# Patient Record
Sex: Male | Born: 2005 | Race: White | Hispanic: No | Marital: Single | Smoking: Never smoker
Health system: Southern US, Community
[De-identification: ages and names within clinical notes are randomized; demographics above are authoritative.]

## PROBLEM LIST (undated history)

## (undated) DIAGNOSIS — K219 Gastro-esophageal reflux disease without esophagitis: Secondary | ICD-10-CM

## (undated) DIAGNOSIS — R519 Headache, unspecified: Secondary | ICD-10-CM

## (undated) DIAGNOSIS — K8020 Calculus of gallbladder without cholecystitis without obstruction: Secondary | ICD-10-CM

## (undated) DIAGNOSIS — J45909 Unspecified asthma, uncomplicated: Secondary | ICD-10-CM

## (undated) DIAGNOSIS — Z8669 Personal history of other diseases of the nervous system and sense organs: Secondary | ICD-10-CM

## (undated) HISTORY — DX: Body mass index (BMI) 50.0-59.9, adult: E66.01

## (undated) HISTORY — DX: Personal history of other diseases of the nervous system and sense organs: Z86.69

## (undated) HISTORY — PX: WISDOM TOOTH EXTRACTION: SHX21

## (undated) HISTORY — DX: Calculus of gallbladder without cholecystitis without obstruction: K80.20

## (undated) HISTORY — DX: Gastro-esophageal reflux disease without esophagitis: K21.9

---
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
## 2023-01-06 ENCOUNTER — Inpatient Hospital Stay: Payer: Medicaid Other | Admitting: Oncology

## 2023-01-06 ENCOUNTER — Encounter: Payer: Self-pay | Admitting: Oncology

## 2023-01-06 LAB — FACTOR 5 LEIDEN

## 2023-01-13 ENCOUNTER — Inpatient Hospital Stay: Payer: Medicaid Other | Admitting: Oncology

## 2023-01-14 ENCOUNTER — Telehealth: Payer: Self-pay | Admitting: Oncology

## 2023-01-14 NOTE — Telephone Encounter (Signed)
Spoke to Terry Sharp about rescheduling his missed mychart appt with Dr. Smith Terry. He said he could see his lab results on mychart and felt like he was fine. I told him to let us know if he changes his mind and would like to reschedule.

## 2023-01-18 ENCOUNTER — Ambulatory Visit: Payer: Medicaid Other | Admitting: Surgery

## 2023-03-06 ENCOUNTER — Other Ambulatory Visit: Payer: Self-pay

## 2023-03-06 ENCOUNTER — Observation Stay
Admission: EM | Admit: 2023-03-06 | Discharge: 2023-03-06 | Disposition: A | Discharge status: Home / Self Care | Payer: Medicaid Other | Attending: Emergency Medicine | Admitting: Emergency Medicine

## 2023-03-06 ENCOUNTER — Observation Stay: Payer: Medicaid Other | Admitting: Anesthesiology

## 2023-03-06 ENCOUNTER — Other Ambulatory Visit: Payer: Self-pay | Admitting: Surgery

## 2023-03-06 ENCOUNTER — Emergency Department: Payer: Medicaid Other

## 2023-03-06 ENCOUNTER — Encounter
Admission: EM | Disposition: A | Discharge status: Home / Self Care | Payer: Self-pay | Source: Home / Self Care | Attending: Emergency Medicine

## 2023-03-06 DIAGNOSIS — R1011 Right upper quadrant pain: Secondary | ICD-10-CM | POA: Diagnosis present

## 2023-03-06 DIAGNOSIS — K8000 Calculus of gallbladder with acute cholecystitis without obstruction: Secondary | ICD-10-CM | POA: Diagnosis not present

## 2023-03-06 DIAGNOSIS — Z79899 Other long term (current) drug therapy: Secondary | ICD-10-CM | POA: Insufficient documentation

## 2023-03-06 DIAGNOSIS — K819 Cholecystitis, unspecified: Principal | ICD-10-CM

## 2023-03-06 DIAGNOSIS — J45909 Unspecified asthma, uncomplicated: Secondary | ICD-10-CM | POA: Diagnosis not present

## 2023-03-06 DIAGNOSIS — K8012 Calculus of gallbladder with acute and chronic cholecystitis without obstruction: Secondary | ICD-10-CM | POA: Diagnosis not present

## 2023-03-06 DIAGNOSIS — F17290 Nicotine dependence, other tobacco product, uncomplicated: Secondary | ICD-10-CM | POA: Insufficient documentation

## 2023-03-06 LAB — URINALYSIS, ROUTINE W REFLEX MICROSCOPIC
Bilirubin Urine: NEGATIVE
Glucose, UA: NEGATIVE mg/dL
Hgb urine dipstick: NEGATIVE
Ketones, ur: NEGATIVE mg/dL
Leukocytes,Ua: NEGATIVE
Nitrite: NEGATIVE
Protein, ur: NEGATIVE mg/dL
Specific Gravity, Urine: 1.011 (ref 1.005–1.030)
pH: 6 (ref 5.0–8.0)

## 2023-03-06 LAB — COMPREHENSIVE METABOLIC PANEL
ALT: 54 U/L — ABNORMAL HIGH (ref 0–44)
AST: 30 U/L (ref 15–41)
Albumin: 4.1 g/dL (ref 3.5–5.0)
Alkaline Phosphatase: 47 U/L (ref 38–126)
Anion gap: 11 (ref 5–15)
BUN: 10 mg/dL (ref 6–20)
CO2: 21 mmol/L — ABNORMAL LOW (ref 22–32)
Calcium: 9.3 mg/dL (ref 8.9–10.3)
Chloride: 108 mmol/L (ref 98–111)
Creatinine, Ser: 0.87 mg/dL (ref 0.61–1.24)
GFR, Estimated: >60 mL/min (ref >60)
Glucose, Bld: 100 mg/dL — ABNORMAL HIGH (ref 70–99)
Potassium: 4.4 mmol/L (ref 3.5–5.1)
Sodium: 140 mmol/L (ref 135–145)
Total Bilirubin: 0.9 mg/dL (ref 0.3–1.2)
Total Protein: 7.5 g/dL (ref 6.5–8.1)

## 2023-03-06 LAB — CBC
HCT: 46.5 % (ref 39.0–52.0)
Hemoglobin: 16.1 g/dL (ref 13.0–17.0)
MCH: 30.1 pg (ref 26.0–34.0)
MCHC: 34.6 g/dL (ref 30.0–36.0)
MCV: 86.9 fL (ref 80.0–100.0)
Platelets: 246 10*3/uL (ref 150–400)
RBC: 5.35 MIL/uL (ref 4.22–5.81)
RDW: 12.4 % (ref 11.5–15.5)
WBC: 12.7 10*3/uL — ABNORMAL HIGH (ref 4.0–10.5)
nRBC: 0 % (ref 0.0–0.2)

## 2023-03-06 LAB — LIPASE, BLOOD: Lipase: 29 U/L (ref 11–51)

## 2023-03-06 SURGERY — CHOLECYSTECTOMY, ROBOT-ASSISTED, LAPAROSCOPIC
Anesthesia: General | Site: Abdomen

## 2023-03-06 MED ORDER — SODIUM CHLORIDE 0.9 % IV SOLN
2.0000 g | INTRAVENOUS | Status: DC
  Administered 2023-03-06: 2 g via INTRAVENOUS
  Filled 2023-03-06: qty 20

## 2023-03-06 MED ORDER — SODIUM CHLORIDE 0.9 % IV BOLUS
500.0000 mL | Freq: Once | INTRAVENOUS | Status: AC
  Administered 2023-03-06: 500 mL via INTRAVENOUS

## 2023-03-06 MED ORDER — OXYCODONE-ACETAMINOPHEN 5-325 MG PO TABS
1.0000 | ORAL_TABLET | ORAL | 0 refills | Status: AC | PRN
Start: 2023-03-06 — End: ?

## 2023-03-06 MED ORDER — DEXMEDETOMIDINE HCL IN NACL 80 MCG/20ML IV SOLN
INTRAVENOUS | Status: DC | PRN
Start: 2023-03-06 — End: 2023-03-06
  Administered 2023-03-06 (×5): 4 ug via INTRAVENOUS

## 2023-03-06 MED ORDER — PROPOFOL 10 MG/ML IV BOLUS
INTRAVENOUS | Status: DC | PRN
  Administered 2023-03-06: 300 mg via INTRAVENOUS

## 2023-03-06 MED ORDER — BUPIVACAINE LIPOSOME 1.3 % IJ SUSP
INTRAMUSCULAR | Status: AC
  Filled 2023-03-06: qty 20

## 2023-03-06 MED ORDER — FENTANYL CITRATE (PF) 100 MCG/2ML IJ SOLN
INTRAMUSCULAR | Status: DC | PRN
  Administered 2023-03-06: 25 ug via INTRAVENOUS
  Administered 2023-03-06: 50 ug via INTRAVENOUS
  Administered 2023-03-06: 25 ug via INTRAVENOUS

## 2023-03-06 MED ORDER — OXYCODONE-ACETAMINOPHEN 5-325 MG PO TABS
1.0000 | ORAL_TABLET | ORAL | Status: DC | PRN
  Administered 2023-03-06: 1 via ORAL

## 2023-03-06 MED ORDER — OXYCODONE-ACETAMINOPHEN 5-325 MG PO TABS
ORAL_TABLET | ORAL | Status: AC
  Filled 2023-03-06: qty 1

## 2023-03-06 MED ORDER — SUCCINYLCHOLINE CHLORIDE 200 MG/10ML IV SOSY
PREFILLED_SYRINGE | INTRAVENOUS | Status: AC
  Filled 2023-03-06: qty 10

## 2023-03-06 MED ORDER — PHENYLEPHRINE 80 MCG/ML (10ML) SYRINGE FOR IV PUSH (FOR BLOOD PRESSURE SUPPORT)
PREFILLED_SYRINGE | INTRAVENOUS | Status: DC | PRN
  Administered 2023-03-06: 160 ug via INTRAVENOUS

## 2023-03-06 MED ORDER — 0.9 % SODIUM CHLORIDE (POUR BTL) OPTIME
TOPICAL | Status: DC | PRN
  Administered 2023-03-06: 500 mL

## 2023-03-06 MED ORDER — FENTANYL CITRATE PF 50 MCG/ML IJ SOSY
50.0000 ug | PREFILLED_SYRINGE | INTRAMUSCULAR | Status: DC | PRN
  Administered 2023-03-06: 50 ug via INTRAVENOUS
  Filled 2023-03-06: qty 1

## 2023-03-06 MED ORDER — BUPIVACAINE-EPINEPHRINE (PF) 0.25% -1:200000 IJ SOLN
INTRAMUSCULAR | Status: AC
  Filled 2023-03-06: qty 30

## 2023-03-06 MED ORDER — LACTATED RINGERS IV SOLN: INTRAVENOUS | Status: DC | PRN

## 2023-03-06 MED ORDER — SUGAMMADEX SODIUM 200 MG/2ML IV SOLN
INTRAVENOUS | Status: DC | PRN
  Administered 2023-03-06: 400 mg via INTRAVENOUS

## 2023-03-06 MED ORDER — DEXAMETHASONE SODIUM PHOSPHATE 10 MG/ML IJ SOLN
INTRAMUSCULAR | Status: AC
  Filled 2023-03-06: qty 1

## 2023-03-06 MED ORDER — ONDANSETRON HCL 4 MG/2ML IJ SOLN
INTRAMUSCULAR | Status: AC
  Filled 2023-03-06: qty 2

## 2023-03-06 MED ORDER — MIDAZOLAM HCL 2 MG/2ML IJ SOLN
INTRAMUSCULAR | Status: DC | PRN
  Administered 2023-03-06: 2 mg via INTRAVENOUS

## 2023-03-06 MED ORDER — HYDROMORPHONE HCL 1 MG/ML IJ SOLN
INTRAMUSCULAR | Status: DC | PRN
Start: 2023-03-06 — End: 2023-03-06
  Administered 2023-03-06 (×2): .5 mg via INTRAVENOUS

## 2023-03-06 MED ORDER — ONDANSETRON HCL 4 MG/2ML IJ SOLN
4.0000 mg | Freq: Once | INTRAMUSCULAR | Status: AC | PRN
  Administered 2023-03-06: 4 mg via INTRAVENOUS
  Filled 2023-03-06: qty 2

## 2023-03-06 MED ORDER — LIDOCAINE HCL (PF) 2 % IJ SOLN
INTRAMUSCULAR | Status: AC
  Filled 2023-03-06: qty 5

## 2023-03-06 MED ORDER — DEXAMETHASONE SODIUM PHOSPHATE 10 MG/ML IJ SOLN
INTRAMUSCULAR | Status: DC | PRN
  Administered 2023-03-06: 10 mg via INTRAVENOUS

## 2023-03-06 MED ORDER — FENTANYL CITRATE (PF) 100 MCG/2ML IJ SOLN
INTRAMUSCULAR | Status: AC
  Filled 2023-03-06: qty 2

## 2023-03-06 MED ORDER — MORPHINE SULFATE (PF) 4 MG/ML IV SOLN
4.0000 mg | Freq: Once | INTRAVENOUS | Status: AC
  Administered 2023-03-06: 4 mg via INTRAVENOUS
  Filled 2023-03-06: qty 1

## 2023-03-06 MED ORDER — PHENYLEPHRINE HCL-NACL 20-0.9 MG/250ML-% IV SOLN
INTRAVENOUS | Status: AC
  Filled 2023-03-06: qty 250

## 2023-03-06 MED ORDER — SODIUM CHLORIDE 0.9 % IR SOLN
Status: DC | PRN
Start: 2023-03-06 — End: 2023-03-06
  Administered 2023-03-06: 3000 mL

## 2023-03-06 MED ORDER — ROCURONIUM BROMIDE 10 MG/ML (PF) SYRINGE
PREFILLED_SYRINGE | INTRAVENOUS | Status: AC
  Filled 2023-03-06: qty 10

## 2023-03-06 MED ORDER — FAMOTIDINE IN NACL 20-0.9 MG/50ML-% IV SOLN
20.0000 mg | Freq: Once | INTRAVENOUS | Status: AC
  Administered 2023-03-06: 20 mg via INTRAVENOUS
  Filled 2023-03-06: qty 50

## 2023-03-06 MED ORDER — ONDANSETRON HCL 4 MG/2ML IJ SOLN
4.0000 mg | Freq: Once | INTRAMUSCULAR | Status: AC
  Administered 2023-03-06: 4 mg via INTRAVENOUS
  Filled 2023-03-06: qty 2

## 2023-03-06 MED ORDER — PROPOFOL 10 MG/ML IV BOLUS
INTRAVENOUS | Status: AC
  Filled 2023-03-06: qty 40

## 2023-03-06 MED ORDER — SUCCINYLCHOLINE CHLORIDE 200 MG/10ML IV SOSY
PREFILLED_SYRINGE | INTRAVENOUS | Status: DC | PRN
  Administered 2023-03-06: 180 mg via INTRAVENOUS

## 2023-03-06 MED ORDER — LIDOCAINE HCL (CARDIAC) PF 100 MG/5ML IV SOSY
PREFILLED_SYRINGE | INTRAVENOUS | Status: DC | PRN
  Administered 2023-03-06: 100 mg via INTRAVENOUS

## 2023-03-06 MED ORDER — ACETAMINOPHEN 10 MG/ML IV SOLN
INTRAVENOUS | Status: DC | PRN
Start: 2023-03-06 — End: 2023-03-06
  Administered 2023-03-06: 1000 mg via INTRAVENOUS

## 2023-03-06 MED ORDER — INDOCYANINE GREEN 25 MG IV SOLR
INTRAVENOUS | Status: AC
  Filled 2023-03-06: qty 10

## 2023-03-06 MED ORDER — HYDROMORPHONE HCL 1 MG/ML IJ SOLN: 0.5000 mg | INTRAMUSCULAR | Status: DC | PRN

## 2023-03-06 MED ORDER — HYDROMORPHONE HCL 1 MG/ML IJ SOLN
INTRAMUSCULAR | Status: AC
  Filled 2023-03-06: qty 1

## 2023-03-06 MED ORDER — ONDANSETRON HCL 4 MG/2ML IJ SOLN
4.0000 mg | Freq: Once | INTRAMUSCULAR | Status: AC
  Administered 2023-03-06: 4 mg via INTRAVENOUS

## 2023-03-06 MED ORDER — ACETAMINOPHEN 10 MG/ML IV SOLN
INTRAVENOUS | Status: AC
  Filled 2023-03-06: qty 100

## 2023-03-06 MED ORDER — MIDAZOLAM HCL 2 MG/2ML IJ SOLN
INTRAMUSCULAR | Status: AC
  Filled 2023-03-06: qty 2

## 2023-03-06 MED ORDER — DEXMEDETOMIDINE HCL IN NACL 80 MCG/20ML IV SOLN
INTRAVENOUS | Status: AC
  Filled 2023-03-06: qty 20

## 2023-03-06 MED ORDER — BUPIVACAINE-EPINEPHRINE (PF) 0.25% -1:200000 IJ SOLN
INTRAMUSCULAR | Status: DC | PRN
  Administered 2023-03-06 (×2): 15 mL

## 2023-03-06 MED ORDER — INDOCYANINE GREEN 25 MG IV SOLR
2.5000 mg | Freq: Once | INTRAVENOUS | Status: AC
  Administered 2023-03-06: 2.5 mg via INTRAVENOUS
  Filled 2023-03-06: qty 1

## 2023-03-06 MED ORDER — ROCURONIUM BROMIDE 100 MG/10ML IV SOLN
INTRAVENOUS | Status: DC | PRN
  Administered 2023-03-06: 50 mg via INTRAVENOUS
  Administered 2023-03-06: 45 mg via INTRAVENOUS
  Administered 2023-03-06: 5 mg via INTRAVENOUS

## 2023-03-06 SURGICAL SUPPLY — 47 items
ADH SKN CLS APL DERMABOND .7 (GAUZE/BANDAGES/DRESSINGS) ×2
BAG PRESSURE INF REUSE 3000 (BAG) IMPLANT
BLADE CLIPPER SURG (BLADE) IMPLANT
CLIP LIGATING HEM O LOK PURPLE (MISCELLANEOUS) ×2 IMPLANT
COVER TIP SHEARS 8 DVNC (MISCELLANEOUS) ×2 IMPLANT
DERMABOND ADVANCED .7 DNX12 (GAUZE/BANDAGES/DRESSINGS) ×2 IMPLANT
DRAPE ARM DVNC X/XI (DISPOSABLE) ×8 IMPLANT
DRAPE COLUMN DVNC XI (DISPOSABLE) ×2 IMPLANT
ELECT CAUTERY BLADE 6.4 (BLADE) ×2 IMPLANT
FORCEPS BPLR R/ABLATION 8 DVNC (INSTRUMENTS) ×2 IMPLANT
FORCEPS PROGRASP DVNC XI (FORCEP) ×2 IMPLANT
GLOVE ORTHO TXT STRL SZ7.5 (GLOVE) ×4 IMPLANT
GOWN STRL REUS W/ TWL LRG LVL3 (GOWN DISPOSABLE) ×4 IMPLANT
GOWN STRL REUS W/ TWL XL LVL3 (GOWN DISPOSABLE) ×4 IMPLANT
GOWN STRL REUS W/TWL LRG LVL3 (GOWN DISPOSABLE) ×4
GOWN STRL REUS W/TWL XL LVL3 (GOWN DISPOSABLE) ×4
GRASPER SUT TROCAR 14GX15 (MISCELLANEOUS) ×2 IMPLANT
IRRIGATION STRYKERFLOW (MISCELLANEOUS) IMPLANT
IRRIGATOR STRYKERFLOW (MISCELLANEOUS) ×2
IRRIGATOR SUCT 8 DISP DVNC XI (IRRIGATION / IRRIGATOR) IMPLANT
IV NS IRRIG 3000ML ARTHROMATIC (IV SOLUTION) IMPLANT
KIT PINK PAD W/HEAD ARE REST (MISCELLANEOUS) ×2 IMPLANT
KIT PINK PAD W/HEAD ARM REST (MISCELLANEOUS) ×2 IMPLANT
KIT TURNOVER KIT A (KITS) ×2 IMPLANT
LABEL OR SOLS (LABEL) ×2 IMPLANT
MANIFOLD NEPTUNE II (INSTRUMENTS) ×2 IMPLANT
NDL HYPO 22X1.5 SAFETY MO (MISCELLANEOUS) ×2 IMPLANT
NDL INSUFFLATION 14GA 120MM (NEEDLE) ×2 IMPLANT
NEEDLE HYPO 22X1.5 SAFETY MO (MISCELLANEOUS) ×2 IMPLANT
NEEDLE INSUFFLATION 14GA 120MM (NEEDLE) ×2 IMPLANT
NS IRRIG 500ML POUR BTL (IV SOLUTION) ×2 IMPLANT
PACK LAP CHOLECYSTECTOMY (MISCELLANEOUS) ×2 IMPLANT
SCISSORS MNPLR CVD DVNC XI (INSTRUMENTS) ×2 IMPLANT
SEAL UNIV 5-12 XI (MISCELLANEOUS) ×8 IMPLANT
SET TUBE SMOKE EVAC HIGH FLOW (TUBING) ×2 IMPLANT
SOL ELECTROSURG ANTI STICK (MISCELLANEOUS) ×2
SOLUTION ELECTROSURG ANTI STCK (MISCELLANEOUS) ×2 IMPLANT
SPIKE FLUID TRANSFER (MISCELLANEOUS) ×2 IMPLANT
SUT MNCRL 4-0 (SUTURE) ×2
SUT MNCRL 4-0 27XMFL (SUTURE) ×2
SUT VICRYL 0 UR6 27IN ABS (SUTURE) ×2 IMPLANT
SUTURE MNCRL 4-0 27XMF (SUTURE) ×2 IMPLANT
SYS BAG RETRIEVAL 10MM (BASKET) ×2
SYSTEM BAG RETRIEVAL 10MM (BASKET) ×2 IMPLANT
TRAP FLUID SMOKE EVACUATOR (MISCELLANEOUS) ×2 IMPLANT
TROCAR Z-THREAD FIOS 11X100 BL (TROCAR) ×2 IMPLANT
WATER STERILE IRR 500ML POUR (IV SOLUTION) ×2 IMPLANT

## 2023-03-06 NOTE — Transfer of Care (Signed)
Immediate Anesthesia Transfer of Care Note  Patient: Neldon Labella Marrone  Procedure(s) Performed: XI ROBOTIC ASSISTED LAPAROSCOPIC CHOLECYSTECTOMY (Abdomen) INDOCYANINE GREEN FLUORESCENCE IMAGING (ICG)  Patient Location: PACU  Anesthesia Type:General  Level of Consciousness: awake, alert , oriented, drowsy, and patient cooperative  Airway & Oxygen Therapy: Patient Spontanous Breathing  Post-op Assessment: Report given to RN and Patient moving all extremities X 4  Post vital signs: Reviewed and stable  Last Vitals:  Vitals Value Taken Time  BP 123/90 03/06/23 1742  Temp 36.3 C 03/06/23 1741  Pulse 85 03/06/23 1745  Resp 31 03/06/23 1745  SpO2 95 % 03/06/23 1745  Vitals shown include unfiled device data.  Last Pain:  Vitals:   03/06/23 1324  TempSrc: Oral  PainSc:          Complications: No notable events documented.

## 2023-03-06 NOTE — Interval H&P Note (Signed)
History and Physical Interval Note:  03/06/2023 3:41 PM  [facility]  has presented today for surgery, with the diagnosis of acute calculus cholecystitis.  The various methods of treatment have been discussed with the patient and family. After consideration of risks, benefits and other options for treatment, the patient has consented to  Procedure(s): XI ROBOTIC ASSISTED LAPAROSCOPIC CHOLECYSTECTOMY (N/A) as a surgical intervention.  The patient's history has been reviewed, patient examined, no change in status, stable for surgery.  I have reviewed the patient's chart and labs.  Questions were answered to the patient's satisfaction.     Campbell Lerner

## 2023-03-06 NOTE — Progress Notes (Signed)
For d/c pain control.

## 2023-03-06 NOTE — Anesthesia Procedure Notes (Signed)
Procedure Name: Intubation Date/Time: 03/06/2023 4:21 PM  Performed by: Lonia Mad, CRNAPre-anesthesia Checklist: Patient identified, Patient being monitored, Timeout performed, Emergency Drugs available and Suction available Patient Re-evaluated:Patient Re-evaluated prior to induction Oxygen Delivery Method: Circle system utilized Preoxygenation: Pre-oxygenation with 100% oxygen Induction Type: IV induction and Rapid sequence Ventilation: Mask ventilation without difficulty Laryngoscope Size: Mac and 4 Grade View: Grade I Tube type: Oral Tube size: 8.0 mm Number of attempts: 1 Airway Equipment and Method: Stylet, Patient positioned with wedge pillow and Video-laryngoscopy Placement Confirmation: ETT inserted through vocal cords under direct vision, positive ETCO2, breath sounds checked- equal and bilateral and CO2 detector Secured at: 22 cm Tube secured with: Tape Dental Injury: Teeth and Oropharynx as per pre-operative assessment

## 2023-03-06 NOTE — Anesthesia Postprocedure Evaluation (Signed)
Anesthesia Post Note  Patient: Terry Sharp  Procedure(s) Performed: XI ROBOTIC ASSISTED LAPAROSCOPIC CHOLECYSTECTOMY (Abdomen) INDOCYANINE GREEN FLUORESCENCE IMAGING (ICG)  Patient location during evaluation: PACU Anesthesia Type: General Level of consciousness: awake and alert Pain management: pain level controlled Vital Signs Assessment: post-procedure vital signs reviewed and stable Respiratory status: spontaneous breathing, nonlabored ventilation, respiratory function stable and patient connected to nasal cannula oxygen Cardiovascular status: blood pressure returned to baseline and stable Postop Assessment: no apparent nausea or vomiting Anesthetic complications: no   No notable events documented.   Last Vitals:  Vitals:   03/06/23 1815 03/06/23 1827  BP: 116/76   Pulse: 80 85  Resp: 10 (!) 23  Temp:  (!) 36.4 C  SpO2: 98% 100%    Last Pain:  Vitals:   03/06/23 1827  TempSrc: Temporal  PainSc: 5                  Margarita Croke C Deshaun Weisinger

## 2023-03-06 NOTE — ED Triage Notes (Signed)
Pt to ED for abdominal pain since this AM, states pain woke him up and made him cry. Pain is to R side, sharp. States was suto have gallbladder removed 2 months ago and did not. PT appears to be in significant pain. Has been vomiting today.  Pt does have appendix.

## 2023-03-06 NOTE — Discharge Summary (Signed)
Physician Discharge Summary  Patient ID: Terry Sharp MRN: [id number] DOB/AGE: [birth year] 17 y.o.  Admit date: 03/06/2023 Discharge date: 03/07/2023  Admission Diagnoses: Acute calculus cholecystitis  Discharge Diagnoses:  Principal Problem:   Acute cholecystitis due to biliary calculus   Discharged Condition: good  Hospital Course: Admitted for robotic cholecystectomy, discharged to home following recovery.  Consults: None  Significant Diagnostic Studies: radiology: Ultrasound: See report.  Treatments: surgery: Robotic cholecystectomy with ICG imaging.  Discharge Exam: Blood pressure 116/76, pulse 85, temperature (!) 97.5 F (36.4 C), temperature source Temporal, resp. rate (!) 23, height 5\' 11"  (1.803 m), weight (!) 164.2 kg, SpO2 100%. General appearance: alert, cooperative, and no distress Resp: clear to auscultation bilaterally GI: soft, non-tender; bowel sounds normal; no masses,  no organomegaly Incision/Wound: Incisions intact with Dermabond.  Disposition: Discharge disposition: 01-Home or Self Care       Discharge Instructions     Call MD for:  persistant nausea and vomiting   Complete by: As directed    Call MD for:  redness, tenderness, or signs of infection (pain, swelling, redness, odor or green/yellow discharge around incision site)   Complete by: As directed    Call MD for:  severe uncontrolled pain   Complete by: As directed    Diet - low sodium heart healthy   Complete by: As directed    Discharge instructions   Complete by: As directed    May resume aspirin or other anticoagulants after 48 hours.   Discharge wound care:   Complete by: As directed    Your incision was closed with Dermabond.  It is best to keep it clean and dry, it will tolerate a brief shower, but do not soak it or apply any creams or lotions to the incisions.  The Dermabond should gradually flake off over time.  Keep it open to air so you can evaluate your incisions.   Dermabond assists the underlying sutures to keep your incision closed and protected from infection.  Should you develop some drainage from your incision, some drops of drainage would be okay but if it persists continue to put keep a dry dressing over it.   Driving Restrictions   Complete by: As directed    No driving until cleared after follow-up appointment.  Is not advised to drive while taking narcotic pain medications or in significant pain.   Increase activity slowly   Complete by: As directed    Lifting restrictions   Complete by: As directed    Strongly advised against any form of lifting greater than 15 pounds over the next 4 to 6 weeks.  This involves pushing/pulling movements as well.  After 4 weeks when may gradually engage in more activities remaining aware of any new pain/tenderness elicited, and avoiding those for the full duration of 6 weeks.  Walking is encouraged.  Climbing stairs with caution.      Allergies as of 03/06/2023       Reactions   Amoxicillin Other (See Comments)   Blisters in mouth        Medication List     TAKE these medications    ibuprofen 800 MG tablet Commonly known as: ADVIL Take 1 tablet (800 mg total) by mouth every 8 (eight) hours as needed for moderate pain.   ondansetron 4 MG disintegrating tablet Commonly known as: ZOFRAN-ODT Take 1 tablet (4 mg total) by mouth every 8 (eight) hours as needed for nausea or vomiting.   oxyCODONE 5 MG immediate  release tablet Commonly known as: Roxicodone Take 1 tablet (5 mg total) by mouth every 6 (six) hours as needed for severe pain.   oxyCODONE-acetaminophen 5-325 MG tablet Commonly known as: Percocet Take 1-2 tablets by mouth every 4 (four) hours as needed for severe pain.   ursodiol 300 MG capsule Commonly known as: ACTIGALL Take 1 capsule (300 mg total) by mouth 3 (three) times daily.               Discharge Care Instructions  (From admission, onward)           Start      Ordered   03/06/23 0000  Discharge wound care:       Comments: Your incision was closed with Dermabond.  It is best to keep it clean and dry, it will tolerate a brief shower, but do not soak it or apply any creams or lotions to the incisions.  The Dermabond should gradually flake off over time.  Keep it open to air so you can evaluate your incisions.  Dermabond assists the underlying sutures to keep your incision closed and protected from infection.  Should you develop some drainage from your incision, some drops of drainage would be okay but if it persists continue to put keep a dry dressing over it.   03/06/23 1745            Follow-up Information     Donovan Kail, PA-C. Schedule an appointment as soon as possible for a visit in 2 week(s).   Specialty: Physician Assistant Contact information: [address] [place] [address] [phone]                 Signed: Campbell Lerner, M.D., [facility] 03/07/2023, 3:26 PM

## 2023-03-06 NOTE — ED Notes (Signed)
Pt is hard stick, had to do hand IV for meds. Pt vomiting and in severe pain.

## 2023-03-06 NOTE — Op Note (Signed)
Robotic cholecystectomy with Indocyamine Green Ductal Imaging.   Pre-operative Diagnosis: Acute calculus cholecystitis  Post-operative Diagnosis:  Same.  Procedure: Robotic assisted laparoscopic cholecystectomy with Indocyamine Green Ductal Imaging.   Surgeon: Campbell Lerner, M.D., FACS  Anesthesia: General. with endotracheal tube  Findings: Edematous gallbladder with adhesions.  Estimated Blood Loss: 3 mL         Drains: None         Specimens: Gallbladder           Complications: none  Procedure Details  The patient was seen again in the Holding Room.  2.5 mg dose of ICG was administered intravenously.   The benefits, complications, treatment options, risks and expected outcomes were again reviewed with the patient. The likelihood of improving the patient's symptoms with return to their baseline status is good.  The patient and/or family concurred with the proposed plan, giving informed consent, again alternatives reviewed.  The patient was taken to Operating Room, identified, and the procedure verified as robotic assisted laparoscopic cholecystectomy.  Prior to the induction of general anesthesia, antibiotic prophylaxis was administered. VTE prophylaxis was in place. General endotracheal anesthesia was then administered and tolerated well. The patient was positioned in the supine position.  After the induction, the abdomen was prepped with Chloraprep and draped in the sterile fashion.  A Time Out was held and the above information confirmed.  Local infiltration with 0.25% Marcaine with epinephrine is utilized for all skin incisions.  Made a 12 mm incision on the right periumbilical site, I advanced an optical 11mm port under direct visualization into the peritoneal cavity.  Once the peritoneum was penetrated, insufflation was initiated.  The trocar was then advanced into the abdominal cavity under direct visualization. Pneumoperitoneum was then continued utilizing CO2 at 15 mmHg or  less and tolerated well without any adverse changes in the patient's vital signs.  Two 8.5-mm ports were placed in the left lower quadrant and laterally, and one to the right lower quadrant, all under direct vision. Local infiltration with 0.25% Marcaine with epinephrine is utilized for all port sites with deep infiltration under visualization.   The patient was positioned  in reverse Trendelenburg, tilted the patient's left side down.  Da Vinci XI robot was then positioned on to the patient's left side, and docked.  The gallbladder was identified, the fundus grasped via the arm 4 Prograsp and retracted cephalad. Adhesions were lysed with scissors and cautery.  The infundibulum was identified grasped and retracted laterally, exposing the peritoneum overlying the triangle of Calot. This was then opened and dissected using cautery & scissors. An extended critical view of the cystic duct and cystic artery was obtained, aided by the ICG via FireFly which improved localization of the ductal anatomy.    The cystic duct was clearly identified and dissected to isolation.   Artery well isolated and clipped, and the cystic duct was triple clipped and divided with scissors, as close to the gallbladder neck as feasible, thus leaving two on the remaining stump.  The specimen side of the artery is sealed with bipolar and divided with monopolar scissors.   The gallbladder was taken from the gallbladder fossa in a retrograde fashion with the electrocautery. The gallbladder was removed and placed in an Endocatch bag.  The liver bed is inspected. Hemostasis was confirmed.  The robot was undocked and moved away from the operative field. NSS irrigation was utilized and was aspirated clear.  The gallbladder and Endocatch sac were then removed through the paraumbilical  port site.   Inspection of the right upper quadrant was performed. No bleeding, bile duct injury or leak, or bowel injury was noted. The infra-umbilical port  site fascia was closed with interrumpted 0 Vicryl sutures using PMI/cone under direct visualization. Pneumoperitoneum was released and ports removed.  4-0 subcuticular Monocryl was used to close the skin. Dermabond was  applied.  The patient was then extubated and brought to the recovery room in stable condition. Sponge, lap, and needle counts were correct at closure and at the conclusion of the case.               Campbell Lerner, M.D., [facility] 03/06/2023 5:44 PM

## 2023-03-06 NOTE — Discharge Instructions (Signed)
AMBULATORY SURGERY  ?DISCHARGE INSTRUCTIONS ? ? ?The drugs that you were given will stay in your system until tomorrow so for the next 24 hours you should not: ? ?Drive an automobile ?Make any legal decisions ?Drink any alcoholic beverage ? ? ?You may resume regular meals tomorrow.  Today it is better to start with liquids and gradually work up to solid foods. ? ?You may eat anything you prefer, but it is better to start with liquids, then soup and crackers, and gradually work up to solid foods. ? ? ?Please notify your doctor immediately if you have any unusual bleeding, trouble breathing, redness and pain at the surgery site, drainage, fever, or pain not relieved by medication. ? ? ? ?Additional Instructions: ? ? ? ?Please contact your physician with any problems or Same Day Surgery at [phone], Monday through Friday 6 am to 4 pm, or [place] at [facility] number at [phone].  ?

## 2023-03-06 NOTE — Anesthesia Preprocedure Evaluation (Addendum)
Anesthesia Evaluation  Patient identified by MRN, date of birth, ID band Patient awake    Reviewed: Allergy & Precautions, H&P , NPO status , Patient's Chart, lab work & pertinent test results  Airway Mallampati: III  TM Distance: <3 FB Neck ROM: Full  Mouth opening: Limited Mouth Opening Comment: Short, thick neck. Dental no notable dental hx.    Pulmonary asthma , Current Smoker and Patient abstained from smoking.   Pulmonary exam normal breath sounds clear to auscultation       Cardiovascular negative cardio ROS Normal cardiovascular exam Rhythm:Regular Rate:Normal     Neuro/Psych  Headaches negative neurological ROS  negative psych ROS   GI/Hepatic negative GI ROS, Neg liver ROS,,,  Endo/Other  negative endocrine ROS    Renal/GU negative Renal ROS  negative genitourinary   Musculoskeletal negative musculoskeletal ROS (+)    Abdominal  (+) + obese  Peds negative pediatric ROS (+)  Hematology negative hematology ROS (+)   Anesthesia Other Findings Smoker GERD Asthma as a child Amoxicillin causes rash Hx migraine headaches Heavy snoring, daytime drowsiness, morbid obesity, male, very high likelihood of sleep apnea. Discussed risks of untreated sleep apnea and urged patient to have sleep study after recovery from this surgery.  Discussed possible TAP/QL1 block postop if needed for pain, versus IV pain meds in PACU.    Reproductive/Obstetrics negative OB ROS                             Anesthesia Physical Anesthesia Plan  ASA: 3  Anesthesia Plan: General ETT   Post-op Pain Management:    Induction: Intravenous, Rapid sequence and Cricoid pressure planned  PONV Risk Score and Plan: 3 and Ondansetron  Airway Management Planned: Oral ETT  Additional Equipment:   Intra-op Plan:   Post-operative Plan: Extubation in OR  Informed Consent: I have reviewed the patients History and  Physical, chart, labs and discussed the procedure including the risks, benefits and alternatives for the proposed anesthesia with the patient or authorized representative who has indicated his/her understanding and acceptance.     Dental Advisory Given  Plan Discussed with: Anesthesiologist, CRNA and Surgeon  Anesthesia Plan Comments: (Patient consented for risks of anesthesia including but not limited to:  - adverse reactions to medications - damage to eyes, teeth, lips or other oral mucosa - nerve damage due to positioning  - sore throat or hoarseness - Damage to heart, brain, nerves, lungs, other parts of body or loss of life Plan McGrath videolaryngoscope for intubation Patient voiced understanding.)        Anesthesia Quick Evaluation

## 2023-03-06 NOTE — H&P (Signed)
Patient ID: Terry Sharp, male   DOB: 10-31-2005, 17 y.o.   MRN: [id number]  Chief Complaint: Right upper quadrant pain  History of Present Illness Terry Sharp is a 17 y.o. male with known cholelithiasis, symptomatic from last June.  Surgery deferred for evaluation for coagulopathy.  Tested negative for anti-Leiden factor.  Had had tolerated postprandial discomfort, however awoke this morning and severe right upper quadrant pain that was unrelenting.  He reports his last meal was chips from the evening prior.  Denies fevers chills, no known jaundice or acholic stools.  No prior abdominal surgeries.  Past Medical History Past Medical History:  Diagnosis Date   Asthma    as a child   Cholelithiasis    Headache    Hx of migraine headaches    Morbid obesity with BMI of 50.0-59.9, adult (HCC)       Past Surgical History:  Procedure Laterality Date   WISDOM TOOTH EXTRACTION      Allergies  Allergen Reactions   Amoxicillin Other (See Comments)    Blisters in mouth    Current Facility-Administered Medications  Medication Dose Route Frequency Provider Last Rate Last Admin   cefTRIAXone (ROCEPHIN) 2 g in sodium chloride 0.9 % 100 mL IVPB  2 g Intravenous Q24H Campbell Lerner, MD       Current Outpatient Medications  Medication Sig Dispense Refill   ibuprofen (ADVIL) 800 MG tablet Take 1 tablet (800 mg total) by mouth every 8 (eight) hours as needed for moderate pain. (Patient not taking: Reported on 03/06/2023) 30 tablet 1   ondansetron (ZOFRAN-ODT) 4 MG disintegrating tablet Take 1 tablet (4 mg total) by mouth every 8 (eight) hours as needed for nausea or vomiting. (Patient not taking: Reported on 03/06/2023) 30 tablet 1   oxyCODONE (ROXICODONE) 5 MG immediate release tablet Take 1 tablet (5 mg total) by mouth every 6 (six) hours as needed for severe pain. (Patient not taking: Reported on 03/06/2023) 30 tablet 0   oxyCODONE-acetaminophen (PERCOCET) 5-325 MG tablet Take 1-2  tablets by mouth every 4 (four) hours as needed for severe pain. 30 tablet 0   ursodiol (ACTIGALL) 300 MG capsule Take 1 capsule (300 mg total) by mouth 3 (three) times daily. (Patient not taking: Reported on 03/06/2023) 90 capsule 1    Family History Family History  Problem Relation Age of Onset   Migraines Father       Social History Social History   Tobacco Use   Smoking status: Never    Passive exposure: Never   Smokeless tobacco: Never   Tobacco comments:    vaping  Vaping Use   Vaping status: Every Day   Substances: Nicotine, Flavoring  Substance Use Topics   Alcohol use: Yes    Comment: occasionally   Drug use: Yes    Frequency: 7.0 times per week    Types: Marijuana        Review of Systems  All other systems reviewed and are negative.    Physical Exam Blood pressure 132/72, pulse (!) 51, temperature 97.7 F (36.5 C), temperature source Oral, resp. rate (!) 21, height 5\' 11"  (1.803 m), weight (!) 164.2 kg, SpO2 95%. Last Weight  Most recent update: 03/06/2023 10:24 AM    Weight  164.2 kg (362 lb)               CONSTITUTIONAL: Well developed, and nourished, appropriately responsive and aware without distress.   EYES: Sclera non-icteric.   EARS, NOSE,  MOUTH AND THROAT:  The oropharynx is clear. Oral mucosa is pink and moist.     Hearing is intact to voice.  NECK: Trachea is midline, and there is no jugular venous distension.  LYMPH NODES:  Lymph nodes in the neck are not appreciated. RESPIRATORY:  Lungs are clear, and breath sounds are equal bilaterally.  Normal respiratory effort without pathologic use of accessory muscles. CARDIOVASCULAR: Heart is regular in rate and rhythm.   Well perfused.  GI: The abdomen is tender in the right upper quadrant, otherwise soft, nontender, and nondistended. There were no palpable masses.  I did not appreciate hepatosplenomegaly.  MUSCULOSKELETAL:  Symmetrical muscle tone appreciated in all four extremities.    SKIN:  Skin turgor is normal. No pathologic skin lesions appreciated.  NEUROLOGIC:  Motor and sensation appear grossly normal.  Cranial nerves are grossly without defect. PSYCH:  Alert and oriented to person, place and time. Affect is appropriate for situation.  Data Reviewed I have personally reviewed what is currently available of the patient's imaging, recent labs and medical records.   Labs:     Latest Ref Rng & Units 03/06/2023   10:26 AM 12/10/2022   10:31 AM 09/02/2022    5:49 AM  CBC  WBC 4.0 - 10.5 K/uL 12.7  9.0  13.4   Hemoglobin 13.0 - 17.0 g/dL 40.9  81.1  91.4   Hematocrit 39.0 - 52.0 % 46.5  46.8  47.3   Platelets 150 - 400 K/uL 246  215  234       Latest Ref Rng & Units 03/06/2023   10:26 AM 12/10/2022   10:31 AM 09/02/2022    5:49 AM  CMP  Glucose 70 - 99 mg/dL 782  956  213   BUN 6 - 20 mg/dL 10  10  13    Creatinine 0.61 - 1.24 mg/dL 0.86  5.78  4.69   Sodium 135 - 145 mmol/L 140  141  141   Potassium 3.5 - 5.1 mmol/L 4.4  4.1  3.5   Chloride 98 - 111 mmol/L 108  110  109   CO2 22 - 32 mmol/L 21  24  26    Calcium 8.9 - 10.3 mg/dL 9.3  9.2  9.0   Total Protein 6.5 - 8.1 g/dL 7.5  6.9  7.2   Total Bilirubin 0.3 - 1.2 mg/dL 0.9  0.3  0.5   Alkaline Phos 38 - 126 U/L 47  50  65   AST 15 - 41 U/L 30  18  24    ALT 0 - 44 U/L 54  33  55       Imaging: Radiological images reviewed:   Within last 24 hrs: US Abdomen Limited RUQ (LIVER/GB)  Result Date: 03/06/2023 CLINICAL DATA:  Upper abdominal pain EXAM: ULTRASOUND ABDOMEN LIMITED RIGHT UPPER QUADRANT COMPARISON:  12/10/2022 FINDINGS: Gallbladder: Multiple echogenic, shadowing stones within the gallbladder, largest measuring 2.4 cm in size. Layering sludge is present within the gallbladder. Wall thickness within normal limits. No pericholecystic fluid. A positive Murphy sign was noted by sonographer. Common bile duct: Diameter: 3 mm. Liver: No focal lesion identified. Diffusely increased hepatic parenchymal echogenicity.  Portal vein is patent on color Doppler imaging with normal direction of blood flow towards the liver. Other: None. IMPRESSION: 1. Cholelithiasis with positive Murphy sign. No gallbladder wall thickening or pericholecystic fluid. Findings are equivocal for acute cholecystitis. Consider further evaluation with nuclear medicine HIDA scan. 2. Hepatic steatosis. Electronically Signed   By: Janyth Pupa  Plundo D.O.   On: 03/06/2023 12:53    Assessment     Patient Active Problem List   Diagnosis Date Noted   Acute cholecystitis due to biliary calculus 03/06/2023   Migraine without aura and with status migrainosus, not intractable 08/17/2019   Migraine without aura and without status migrainosus, not intractable 08/17/2019   Morbid obesity (HCC) 08/17/2019   Family history of migraine headaches in father 08/17/2019    Plan      XI ROBOTIC ASSISTED LAPAROSCOPIC CHOLECYSTECTOMY:    This was discussed thoroughly.  Optimal plan is for robotic cholecystectomy utilizing ICG imaging. Risks and benefits have been discussed with the patient which include but are not limited to anesthesia, bleeding, infection, biliary ductal injury, resulting in leak or stenosis, other associated unanticipated injuries affiliated with laparoscopic surgery.   Reviewed that removing the gallbladder will only address the symptoms related to the gallbladder itself.  I believe there is the desire to proceed, accepting the risks with understanding.  Questions elicited and answered to satisfaction.    No guarantees ever expressed or implied.  Face-to-face time spent with the patient and accompanying care providers(if present) was 30 minutes, with more than 50% of the time spent counseling, educating, and coordinating care of the patient.    These notes generated with voice recognition software. I apologize for typographical errors.  Campbell Lerner M.D., FACS 03/06/2023, 3:39 PM

## 2023-03-06 NOTE — ED Provider Notes (Signed)
[facility] Provider Note    Event Date/Time   First MD Initiated Contact with Patient 03/06/23 1111     (approximate)   History   Abdominal Pain   HPI  Terry Sharp is a 17 y.o. male with a known history of gallstones who presents with complaints of right upper quadrant abdominal pain which started this morning.  He reports the pain is severe and radiates to his back.  He feels similar to prior episodes of gallbladder disease.  He reports he was supposed to have his gallbladder removed 2 months ago but was unable to afford it.  No fevers.  Positive nausea vomiting     Physical Exam   Triage Vital Signs: ED Triage Vitals  Encounter Vitals Group     BP 03/06/23 1027 101/83     Systolic BP Percentile --      Diastolic BP Percentile --      Pulse Rate 03/06/23 1027 86     Resp 03/06/23 1027 (!) 21     Temp 03/06/23 1027 97.6 F (36.4 C)     Temp Source 03/06/23 1027 Oral     SpO2 03/06/23 1027 100 %     Weight 03/06/23 1024 (!) 164.2 kg (362 lb)     Height 03/06/23 1024 1.803 m (5\' 11" )     Head Circumference --      Peak Flow --      Pain Score 03/06/23 1024 9     Pain Loc --      Pain Education --      Exclude from Growth Chart --     Most recent vital signs: Vitals:   03/06/23 1027 03/06/23 1324  BP: 101/83 132/72  Pulse: 86 (!) 51  Resp: (!) 21   Temp: 97.6 F (36.4 C) 97.7 F (36.5 C)  SpO2: 100% 95%     General: Awake, no distress.  CV:  Good peripheral perfusion.  Resp:  Normal effort.  Abd:  No distention.  Tenderness palpation right upper quadrant Other:     ED Results / Procedures / Treatments   Labs (all labs ordered are listed, but only abnormal results are displayed) Labs Reviewed  COMPREHENSIVE METABOLIC PANEL - Abnormal; Notable for the following components:      Result Value   CO2 21 (*)    Glucose, Bld 100 (*)    ALT 54 (*)    All other components within normal limits  CBC - Abnormal; Notable for  the following components:   WBC 12.7 (*)    All other components within normal limits  URINALYSIS, ROUTINE W REFLEX MICROSCOPIC - Abnormal; Notable for the following components:   Color, Urine YELLOW (*)    APPearance CLEAR (*)    All other components within normal limits  LIPASE, BLOOD     EKG     RADIOLOGY Ultrasound pending    PROCEDURES:  Critical Care performed:   Procedures   MEDICATIONS ORDERED IN ED: Medications  ondansetron (ZOFRAN) injection 4 mg (4 mg Intravenous Given 03/06/23 1042)  morphine (PF) 4 MG/ML injection 4 mg (4 mg Intravenous Given 03/06/23 1120)  ondansetron (ZOFRAN) injection 4 mg (4 mg Intravenous Given 03/06/23 1120)  sodium chloride 0.9 % bolus 500 mL (0 mLs Intravenous Stopped 03/06/23 1236)  morphine (PF) 4 MG/ML injection 4 mg (4 mg Intravenous Given 03/06/23 1237)     IMPRESSION / MDM / ASSESSMENT AND PLAN / ED COURSE  I reviewed the  triage vital signs and the nursing notes. Patient's presentation is most consistent with acute presentation with potential threat to life or bodily function.   Patient presents with right upper quadrant abdominal pain, differential includes cholecystitis, cholelithiasis, choledocholithiasis, gastritis  Will obtain labs, give IV morphine, IV Zofran, IV fluids.  Obtain ultrasound of the right upper quadrant to evaluate the gallbladder and reevaluate.  Positive Murphy sign on gallbladder but no evidence of pericholecystic fluid.  Discussed with Dr. Claudine Mouton of surgery who will admit the patient given continued pain     FINAL CLINICAL IMPRESSION(S) / ED DIAGNOSES   Final diagnoses:  Cholecystitis     Rx / DC Orders   ED Discharge Orders     None        Note:  This document was prepared using Dragon voice recognition software and may include unintentional dictation errors.   Jene Every, MD 03/06/23 [phone]

## 2023-03-08 ENCOUNTER — Telehealth: Payer: Self-pay | Admitting: *Deleted

## 2023-03-08 NOTE — Telephone Encounter (Signed)
Patients father called and needs to come pick up a work note for the patient.   Patient doesn't know when he wants to return to work. He needs one stating that he had surgery.  03/06/23 Dr Claudine Mouton gallbladder

## 2023-03-08 NOTE — Telephone Encounter (Signed)
Patient's father notified that his work note is ready.

## 2023-03-09 LAB — SURGICAL PATHOLOGY

## 2023-03-15 ENCOUNTER — Encounter: Payer: Self-pay | Admitting: Surgery

## 2023-03-17 ENCOUNTER — Ambulatory Visit (INDEPENDENT_AMBULATORY_CARE_PROVIDER_SITE_OTHER): Payer: Medicaid Other | Admitting: Physician Assistant

## 2023-03-17 ENCOUNTER — Encounter: Payer: Self-pay | Admitting: Physician Assistant

## 2023-03-17 VITALS — BP 116/72 | HR 73 | Temp 98.5°F | Ht 71.0 in | Wt 346.4 lb

## 2023-03-17 DIAGNOSIS — K8000 Calculus of gallbladder with acute cholecystitis without obstruction: Secondary | ICD-10-CM

## 2023-03-17 DIAGNOSIS — Z09 Encounter for follow-up examination after completed treatment for conditions other than malignant neoplasm: Secondary | ICD-10-CM

## 2023-03-17 DIAGNOSIS — K8020 Calculus of gallbladder without cholecystitis without obstruction: Secondary | ICD-10-CM

## 2023-03-17 NOTE — Progress Notes (Signed)
[facility] POST-OP OFFICE VISIT  03/17/2023  HPI: Terry Sharp is a 17 y.o. male 11 days s/p robotic assisted laparoscopic cholecystectomy for acute cholecystitis with Dr Claudine Mouton   Overall doing okay Expected abdominal soreness; did have to move a few days ago which exacerbated this Unfortunately, he continues to have intermittent episodes of emesis in the morning, small amounts of green thin liquid. This is UNCHANGED since surgery.  Fluctuates between constipation and diarrhea He is tolerating PO but scarred to eat Incisions are healing well Ambulating without issue   Vital signs: BP 116/72 (BP Location: Right Arm, Patient Position: Sitting, Cuff Size: Large)   Pulse 73   Temp 98.5 F (36.9 C) (Oral)   Ht 5\' 11"  (1.803 m)   Wt (!) 346 lb 6.4 oz (157.1 kg)   SpO2 96%   BMI 48.31 kg/m    Physical Exam: Constitutional: Well appearing male, NAD Abdomen: Soft, non-tender, non-distended, no rebound/guarding Skin: Laparoscopic incisions are healing well, no erythema or drainage. Healing ecchymosis to the right lateral port site.   Assessment/Plan: This is a 17 y.o. male 11 days s/p robotic assisted laparoscopic cholecystectomy for acute cholecystitis with Dr Claudine Mouton    - Unfortunately, he continues to have unchanged episodes of nausea/emesis after surgery. This is unchanged from pre-operatively. He has tried antiemetics without improvements. Explained that sometimes we remove the gallbladder and GI symptoms do not resolve. May also take a few weeks for his body to adjust. If these symptoms persist longer than 6 weeks post-operatively, may need to consider GI evaluation. He understands.   - Pain control prn  - Reviewed wound care recommendation  - Reviewed lifting restrictions; 4 weeks total - work note  - Reviewed surgical pathology; CCC  - I did offer 2 week follow up; however he wishes to follow up on as needed basis;  He understands to call with  questions/concerns  -- Lynden Oxford, PA-C [facility] 03/17/2023, 3:28 PM M-F: 7am - 4pm

## 2023-03-31 ENCOUNTER — Encounter: Payer: Medicaid Other | Admitting: Physician Assistant

## 2023-04-13 NOTE — Progress Notes (Signed)
Treating provider accommodation request form faxed to [facility].

## 2023-11-17 ENCOUNTER — Ambulatory Visit: Admitting: Family Medicine

## 2023-11-22 ENCOUNTER — Ambulatory Visit: Admitting: Family Medicine
